# Patient Record
Sex: Male | Born: 1965 | Race: Black or African American | Hispanic: No | Smoking: Never smoker
Health system: Southern US, Community
[De-identification: ages and names within clinical notes are randomized; demographics above are authoritative.]

## PROBLEM LIST (undated history)

## (undated) DIAGNOSIS — E119 Type 2 diabetes mellitus without complications: Secondary | ICD-10-CM

## (undated) DIAGNOSIS — I10 Essential (primary) hypertension: Secondary | ICD-10-CM

---

## 2018-01-13 ENCOUNTER — Other Ambulatory Visit: Payer: Self-pay

## 2018-01-13 ENCOUNTER — Emergency Department (HOSPITAL_COMMUNITY): Payer: BC Managed Care – PPO

## 2018-01-13 ENCOUNTER — Encounter (HOSPITAL_COMMUNITY): Payer: Self-pay | Admitting: Emergency Medicine

## 2018-01-13 ENCOUNTER — Emergency Department (HOSPITAL_COMMUNITY)
Admission: EM | Admit: 2018-01-13 | Discharge: 2018-01-13 | Disposition: A | Discharge status: Home / Self Care | Payer: BC Managed Care – PPO | Attending: Emergency Medicine | Admitting: Emergency Medicine

## 2018-01-13 DIAGNOSIS — S60222A Contusion of left hand, initial encounter: Secondary | ICD-10-CM | POA: Insufficient documentation

## 2018-01-13 DIAGNOSIS — S8002XA Contusion of left knee, initial encounter: Secondary | ICD-10-CM | POA: Diagnosis not present

## 2018-01-13 DIAGNOSIS — Y998 Other external cause status: Secondary | ICD-10-CM | POA: Diagnosis not present

## 2018-01-13 DIAGNOSIS — Y939 Activity, unspecified: Secondary | ICD-10-CM | POA: Diagnosis not present

## 2018-01-13 DIAGNOSIS — S90812A Abrasion, left foot, initial encounter: Secondary | ICD-10-CM | POA: Insufficient documentation

## 2018-01-13 DIAGNOSIS — E119 Type 2 diabetes mellitus without complications: Secondary | ICD-10-CM | POA: Diagnosis not present

## 2018-01-13 DIAGNOSIS — M7918 Myalgia, other site: Secondary | ICD-10-CM

## 2018-01-13 DIAGNOSIS — Y92410 Unspecified street and highway as the place of occurrence of the external cause: Secondary | ICD-10-CM | POA: Insufficient documentation

## 2018-01-13 DIAGNOSIS — S8992XA Unspecified injury of left lower leg, initial encounter: Secondary | ICD-10-CM | POA: Diagnosis present

## 2018-01-13 DIAGNOSIS — I10 Essential (primary) hypertension: Secondary | ICD-10-CM | POA: Diagnosis not present

## 2018-01-13 HISTORY — DX: Essential (primary) hypertension: I10

## 2018-01-13 HISTORY — DX: Type 2 diabetes mellitus without complications: E11.9

## 2018-01-13 LAB — BASIC METABOLIC PANEL
Anion gap: 10 (ref 5–15)
BUN: 15 mg/dL (ref 6–20)
CALCIUM: 9.4 mg/dL (ref 8.9–10.3)
CO2: 25 mmol/L (ref 22–32)
Chloride: 103 mmol/L (ref 98–111)
Creatinine, Ser: 0.92 mg/dL (ref 0.61–1.24)
GFR calc Af Amer: >60 mL/min (ref >60)
GFR calc non Af Amer: >60 mL/min (ref >60)
GLUCOSE: 203 mg/dL — AB (ref 70–99)
Potassium: 3.7 mmol/L (ref 3.5–5.1)
Sodium: 138 mmol/L (ref 135–145)

## 2018-01-13 LAB — I-STAT TROPONIN, ED: TROPONIN I, POC: 0 ng/mL (ref 0.00–0.08)

## 2018-01-13 LAB — CBC
HCT: 41.1 % (ref 39.0–52.0)
Hemoglobin: 12.7 g/dL — ABNORMAL LOW (ref 13.0–17.0)
MCH: 27.7 pg (ref 26.0–34.0)
MCHC: 30.9 g/dL (ref 30.0–36.0)
MCV: 89.5 fL (ref 78.0–100.0)
Platelets: 359 10*3/uL (ref 150–400)
RBC: 4.59 MIL/uL (ref 4.22–5.81)
RDW: 12.5 % (ref 11.5–15.5)
WBC: 7.8 10*3/uL (ref 4.0–10.5)

## 2018-01-13 MED ORDER — TRAMADOL HCL 50 MG PO TABS: 50.0000 mg | ORAL_TABLET | Freq: Four times a day (QID) | ORAL | 0 refills | Status: AC | PRN

## 2018-01-13 MED ORDER — IBUPROFEN 800 MG PO TABS: 800.0000 mg | ORAL_TABLET | Freq: Three times a day (TID) | ORAL | 0 refills | Status: AC | PRN
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
## 2018-01-13 NOTE — ED Notes (Signed)
[facility] that he is now having CP and SOB. Patient to be reassessed in triage.
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
## 2018-01-13 NOTE — Discharge Instructions (Signed)
Return here as needed.  Follow-up with your primary doctor.  Use ice and heat on the areas that are sore.  °
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
